# Patient Record
Sex: Female | Born: 1980 | Race: Black or African American | Hispanic: No | Marital: Single | Smoking: Never smoker
Health system: Southern US, Community
[De-identification: ages and names within clinical notes are randomized; demographics above are authoritative.]

## PROBLEM LIST (undated history)

## (undated) DIAGNOSIS — D8989 Other specified disorders involving the immune mechanism, not elsewhere classified: Secondary | ICD-10-CM

## (undated) HISTORY — PX: INDUCED ABORTION: SHX677

## (undated) HISTORY — PX: DENTAL SURGERY: SHX609

## (undated) HISTORY — PX: TUBAL LIGATION: SHX77

## (undated) HISTORY — PX: BACK SURGERY: SHX140

---

## 2017-05-04 ENCOUNTER — Emergency Department (HOSPITAL_COMMUNITY)
Admission: EM | Admit: 2017-05-04 | Discharge: 2017-05-04 | Disposition: A | Discharge status: Home / Self Care | Payer: Medicare Other | Attending: Emergency Medicine | Admitting: Emergency Medicine

## 2017-05-04 ENCOUNTER — Encounter (HOSPITAL_COMMUNITY): Payer: Self-pay | Admitting: Emergency Medicine

## 2017-05-04 DIAGNOSIS — Z5321 Procedure and treatment not carried out due to patient leaving prior to being seen by health care provider: Secondary | ICD-10-CM | POA: Insufficient documentation

## 2017-05-04 DIAGNOSIS — R21 Rash and other nonspecific skin eruption: Secondary | ICD-10-CM | POA: Diagnosis not present

## 2017-05-04 HISTORY — DX: Other specified disorders involving the immune mechanism, not elsewhere classified: D89.89

## 2017-05-04 NOTE — ED Triage Notes (Signed)
Pt from home with c/o rash to skin on arms and legs x 2 days. Pt states she got a TB test 2 days ago. Pt states she got a reaction shortly thereafter where she got hives on all of her extremities. [facility] where she was given prednisone and benadryl. Pt states reaction came back today. No visible hives. No airway obstruction

## 2017-05-04 NOTE — ED Triage Notes (Signed)
Pt no longer in the lobby when called for a room

## 2017-05-23 ENCOUNTER — Encounter (HOSPITAL_COMMUNITY): Payer: Self-pay | Admitting: Emergency Medicine

## 2017-05-23 ENCOUNTER — Emergency Department (HOSPITAL_COMMUNITY): Payer: Medicare Other

## 2017-05-23 DIAGNOSIS — X509XXA Other and unspecified overexertion or strenuous movements or postures, initial encounter: Secondary | ICD-10-CM | POA: Insufficient documentation

## 2017-05-23 DIAGNOSIS — S8991XA Unspecified injury of right lower leg, initial encounter: Secondary | ICD-10-CM | POA: Insufficient documentation

## 2017-05-23 DIAGNOSIS — M25461 Effusion, right knee: Secondary | ICD-10-CM | POA: Insufficient documentation

## 2017-05-23 DIAGNOSIS — Y999 Unspecified external cause status: Secondary | ICD-10-CM | POA: Diagnosis not present

## 2017-05-23 DIAGNOSIS — Y9389 Activity, other specified: Secondary | ICD-10-CM | POA: Insufficient documentation

## 2017-05-23 DIAGNOSIS — Y92009 Unspecified place in unspecified non-institutional (private) residence as the place of occurrence of the external cause: Secondary | ICD-10-CM | POA: Insufficient documentation

## 2017-05-23 NOTE — ED Triage Notes (Addendum)
Patient c/o right leg pain after falling out of chair while fixing washing machine. Denies head injury and LOC. Hx lymphedema. Ice applied in triage.

## 2017-05-24 ENCOUNTER — Emergency Department (HOSPITAL_COMMUNITY)
Admission: EM | Admit: 2017-05-24 | Discharge: 2017-05-24 | Disposition: A | Discharge status: Home / Self Care | Payer: Medicare Other | Attending: Emergency Medicine | Admitting: Emergency Medicine

## 2017-05-24 ENCOUNTER — Emergency Department (HOSPITAL_COMMUNITY): Payer: Medicare Other

## 2017-05-24 DIAGNOSIS — S8991XA Unspecified injury of right lower leg, initial encounter: Secondary | ICD-10-CM

## 2017-05-24 MED ORDER — IBUPROFEN 800 MG PO TABS
800.0000 mg | ORAL_TABLET | Freq: Once | ORAL | Status: AC | PRN
  Administered 2017-05-24: 800 mg via ORAL
  Filled 2017-05-24: qty 1

## 2017-05-24 MED ORDER — HYDROCODONE-ACETAMINOPHEN 5-325 MG PO TABS: 1.0000 | ORAL_TABLET | Freq: Four times a day (QID) | ORAL | 0 refills | Status: AC | PRN

## 2017-05-24 MED ORDER — NAPROXEN 500 MG PO TABS: 500.0000 mg | ORAL_TABLET | Freq: Two times a day (BID) | ORAL | 0 refills | Status: AC

## 2017-05-24 MED ORDER — KETOROLAC TROMETHAMINE 30 MG/ML IJ SOLN
30.0000 mg | Freq: Once | INTRAMUSCULAR | Status: AC
  Administered 2017-05-24: 30 mg via INTRAVENOUS

## 2017-05-24 MED ORDER — KETOROLAC TROMETHAMINE 30 MG/ML IJ SOLN
INTRAMUSCULAR | Status: AC
  Administered 2017-05-24: 30 mg via INTRAVENOUS
  Filled 2017-05-24: qty 1

## 2017-05-24 NOTE — ED Notes (Signed)
Bed: WA08 Expected date:  Expected time:  Means of arrival:  Comments: 

## 2017-05-24 NOTE — ED Provider Notes (Signed)
[facility]-EMERGENCY DEPT Provider Note   CSN: [id number] Arrival date & time: 05/23/17  2108     History   Chief Complaint Chief Complaint  Patient presents with  . Leg Pain    HPI Regina Lawrence is a 36 y.o. female.  HPI Regina Lawrence is a 36 y.o. female presents to emergency department complaining of right leg injury. Patient states that she was standing on a chair trying to fix her washer when she slipped and fell down onto the floor. Patient landed on the right side. She reports pain to the right knee and ankle, states that her knee is very swollen and she is unable to put any weight. She reports mild tingling sensation to the knee, otherwise no numbness or weakness. Denies head injury. No loss of consciousness. No other complaints.  Past Medical History:  Diagnosis Date  . Autoimmune disorder (HCC)     There are no active problems to display for this patient.   Past Surgical History:  Procedure Laterality Date  . BACK SURGERY    . DENTAL SURGERY    . INDUCED ABORTION    . TUBAL LIGATION      OB History    No data available       Home Medications    Prior to Admission medications   Not on File    Family History History reviewed. No pertinent family history.  Social History Social History  Substance Use Topics  . Smoking status: Never Smoker  . Smokeless tobacco: Never Used  . Alcohol use No     Allergies   Morphine and related and Dalvance [dalbavancin]   Review of Systems Review of Systems  Constitutional: Negative for chills and fever.  Respiratory: Negative for cough, chest tightness and shortness of breath.   Cardiovascular: Negative for chest pain, palpitations and leg swelling.  Gastrointestinal: Negative for abdominal pain, diarrhea, nausea and vomiting.  Genitourinary: Negative for dysuria, flank pain and pelvic pain.  Musculoskeletal: Positive for arthralgias, gait problem and joint swelling. Negative for myalgias, neck pain and neck  stiffness.  Skin: Negative for rash.  Neurological: Negative for dizziness, weakness and headaches.  All other systems reviewed and are negative.    Physical Exam Updated Vital Signs BP (!) 143/96 (BP Location: Left Arm)   Pulse 96   Temp 99.3 F (37.4 C) (Oral)   Resp 18   LMP 05/23/2017   SpO2 100%   Physical Exam  Constitutional: She appears well-developed and well-nourished. No distress.  HENT:  Head: Normocephalic.  Eyes: Conjunctivae are normal.  Neck: Neck supple.  Cardiovascular: Normal rate, regular rhythm and normal heart sounds.   Pulmonary/Chest: Effort normal and breath sounds normal. No respiratory distress. She has no wheezes. She has no rales.  Musculoskeletal: She exhibits no edema.  Large knee effusion. Range of motion limited due to pain. Pain with any flexion of the knee. Diffuse tenderness to palpation. Stability of the knee joint is limited due to pain during exam. Diffuse tenderness to palpation of the ankle. Full range of motion of the ankle joint.  Neurological: She is alert.  Skin: Skin is warm and dry.  Psychiatric: She has a normal mood and affect. Her behavior is normal.  Nursing note and vitals reviewed.    ED Treatments / Results  Labs (all labs ordered are listed, but only abnormal results are displayed) Labs Reviewed - No data to display  EKG  EKG Interpretation None       Radiology Dg Ankle  Complete Right  Result Date: 05/23/2017 CLINICAL DATA:  Post fall with right knee and ankle pain. EXAM: RIGHT ANKLE - COMPLETE 3+ VIEW COMPARISON:  None. FINDINGS: There is no evidence of fracture, dislocation, or joint effusion. There is no evidence of arthropathy or other focal bone abnormality. Diffuse soft tissue edema. IMPRESSION: Diffuse soft tissue edema.  No osseous abnormality. Electronically Signed   By: Rubye Oaks M.D.   On: 05/23/2017 23:08   Ct Knee Right Wo Contrast  Result Date: 05/24/2017 CLINICAL DATA:  Knee pain after  fall from chair. EXAM: CT OF THE RIGHT KNEE WITHOUT CONTRAST TECHNIQUE: Multidetector CT imaging of the RIGHT knee was performed according to the standard protocol. Multiplanar CT image reconstructions were also generated. COMPARISON:  05/23/2017 FINDINGS: Bones/Joint/Cartilage Moderate suprapatellar joint effusion. Subtle lucency along the posteromesial tibial epiphyseal rim, series 8 image 50 and series 7, image 66 suspicious for a tiny nondisplaced fracture that may explain the joint effusion. Ligaments Suboptimally assessed by CT. Muscles and Tendons Negative Soft tissues Negative IMPRESSION: Moderate suprapatellar joint effusion. Subtle linear lucency involving the posteromesial rim of the tibial epiphysis suspicious for nondisplaced fracture. No joint dislocation. Electronically Signed   By: Tollie Eth M.D.   On: 05/24/2017 03:32   Dg Knee Complete 4 Views Right  Result Date: 05/23/2017 CLINICAL DATA:  Right knee and ankle pain after fall. EXAM: RIGHT KNEE - COMPLETE 4+ VIEW COMPARISON:  None. FINDINGS: No evidence of fracture or dislocation. Small to moderate joint effusion. No evidence of arthropathy or other focal bone abnormality. Soft tissues are unremarkable. IMPRESSION: Joint effusion.  No osseous abnormality. Electronically Signed   By: Rubye Oaks M.D.   On: 05/23/2017 23:12    Procedures Procedures (including critical care time)  Medications Ordered in ED Medications  ibuprofen (ADVIL,MOTRIN) tablet 800 mg (800 mg Oral Given 05/24/17 0138)  ketorolac (TORADOL) 30 MG/ML injection 30 mg (30 mg Intravenous Given 05/24/17 0349)     Initial Impression / Assessment and Plan / ED Course  I have reviewed the triage vital signs and the nursing notes.  Pertinent labs & imaging results that were available during my care of the patient were reviewed by me and considered in my medical decision making (see chart for details).     Patient in the emergency department after a fall off of a  chair. Complaining of right knee and ankle pain. Large joint effusion on the knee x-ray. Patient is unable to apply any weight on the leg. Will get CT scan for rule out occult fracture. Difficulty to assess for stability of the joint due to pain.  CT scan showing septal linear lucency involving the posterior medial rim of the tibial epiphysis which is suspicious for nondisplaced fracture. Given this finding, we'll place in a knee immobilizer. Will elevate leg at home. Ice. Norco and naproxen for pain. Discussed return precautions, especially signs and symptoms of compartment syndrome. At this time her compartments are soft, pulses are intact. Sensation and capillary refill less than 2 seconds in her toes. She is stable for discharge home. Orthopedics follow-up provided.   Vitals:   05/23/17 2234 05/24/17 0147 05/24/17 0605  BP: 140/89 (!) 143/96 118/78  Pulse: 100 96 89  Resp: (!) 187 18 16  Temp: 99.3 F (37.4 C)    TempSrc: Oral    SpO2: 100% 100% 100%     Final Clinical Impressions(s) / ED Diagnoses   Final diagnoses:  Injury of right knee, initial encounter  New Prescriptions Discharge Medication List as of 05/24/2017  5:26 AM    START taking these medications   Details  HYDROcodone-acetaminophen (NORCO) 5-325 MG tablet Take 1 tablet by mouth every 6 (six) hours as needed for moderate pain., Starting Fri 05/24/2017, Print    naproxen (NAPROSYN) 500 MG tablet Take 1 tablet (500 mg total) by mouth 2 (two) times daily., Starting Fri 05/24/2017, Print         Crew Goren, [place], PA-C 05/24/17 0615    Molpus, Jonny Ruiz, MD 05/24/17 [phone]

## 2017-05-24 NOTE — Discharge Instructions (Signed)
Ice and elevate your leg several times a day. Use crutches, no weightbearing on your  right leg. Follow-up with orthopedics doctors referred. Naprosyn for pain. Norco for severe pain. Return if any numbness, increased pain to her foot, any new concerning symptoms.

## 2018-04-27 IMAGING — CR DG KNEE COMPLETE 4+V*R*
4 series · 4 of 4 positions shown · non-contrast
Comparison: None.

CLINICAL DATA: Right knee and ankle pain after fall.

EXAM:
RIGHT KNEE - COMPLETE 4+ VIEW

[t knee ap right]
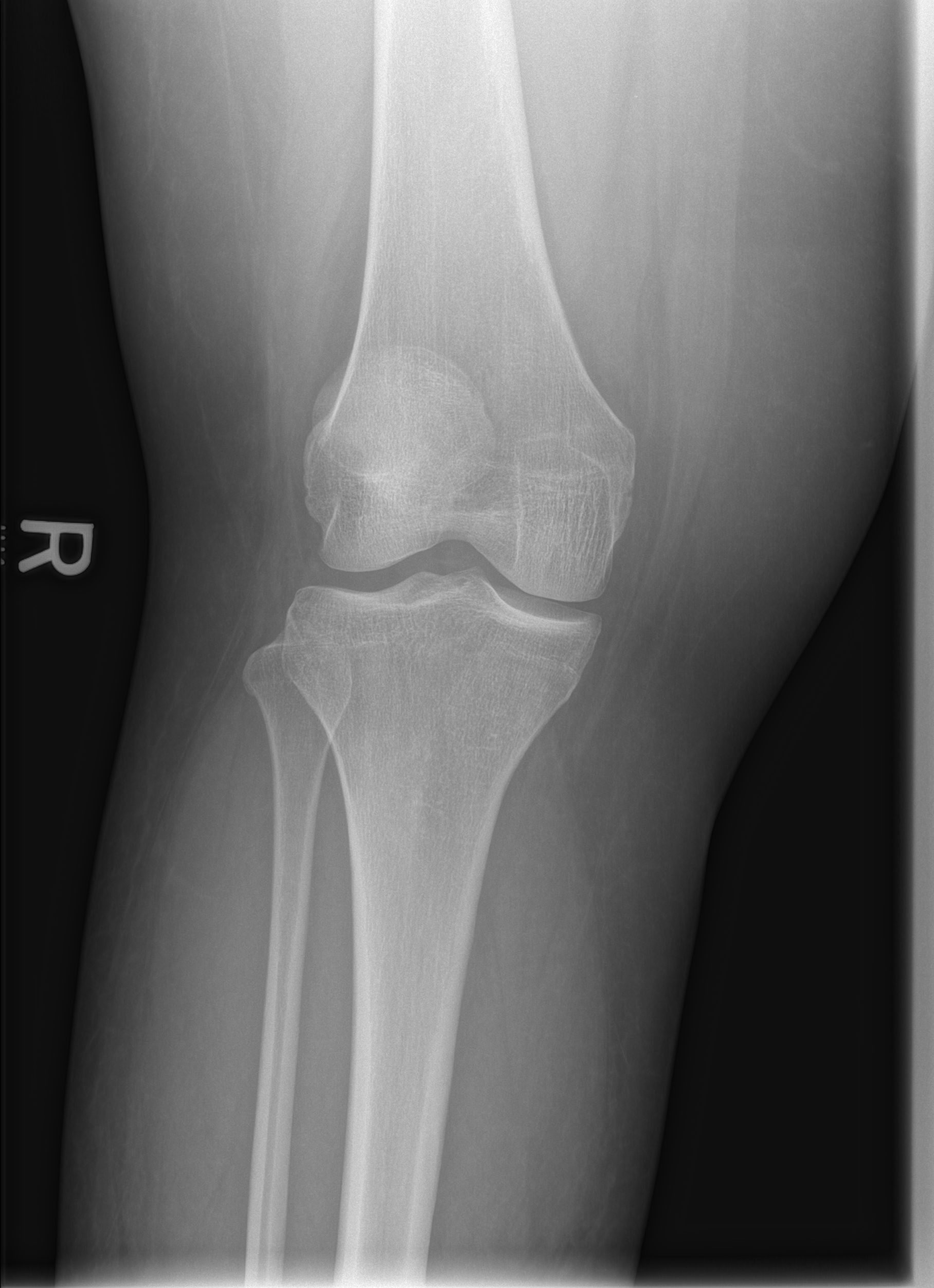

[t knee obl right (1 of 2)]
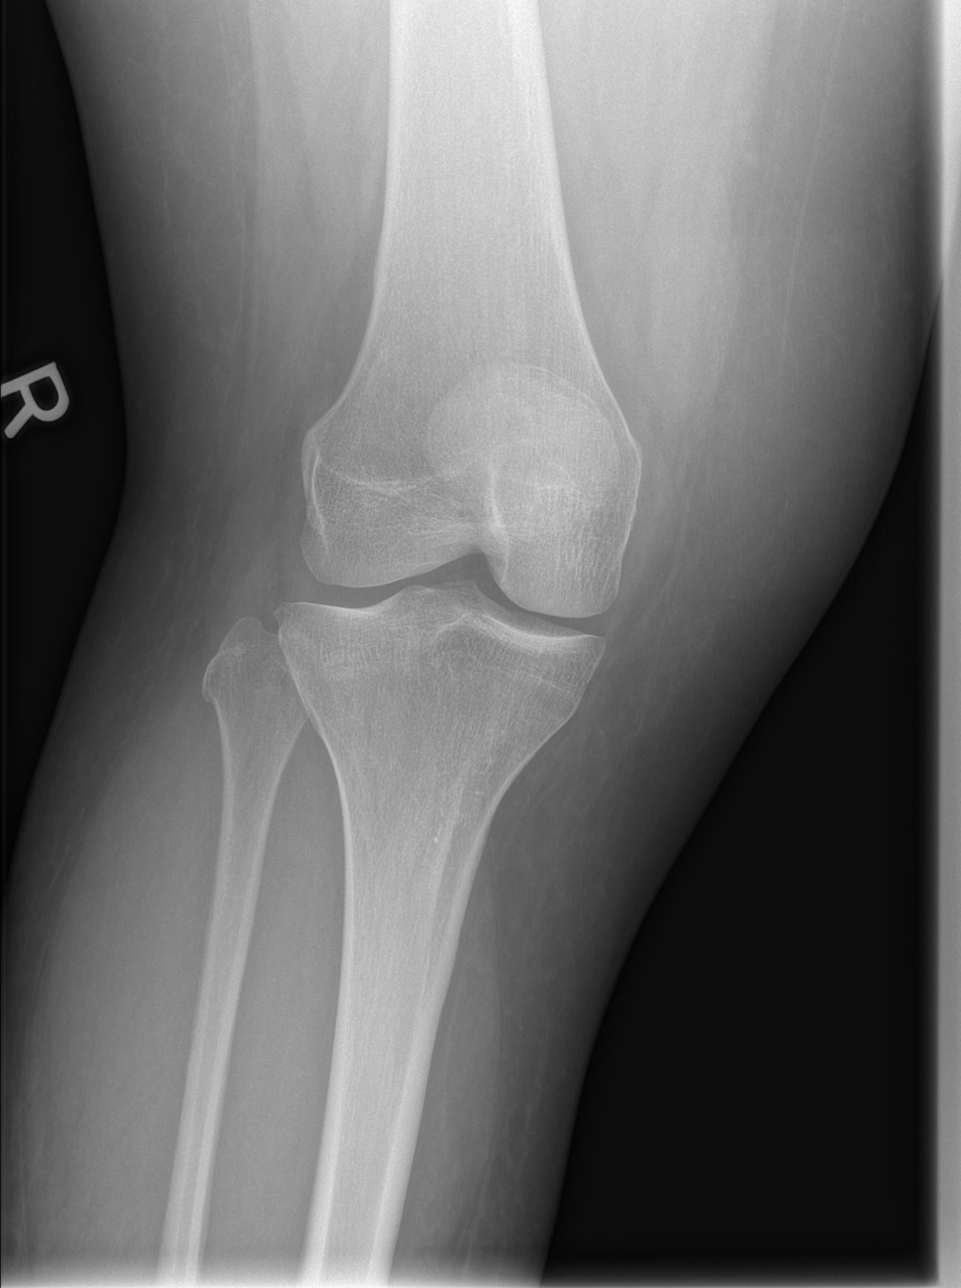

[t knee obl right (2 of 2)]
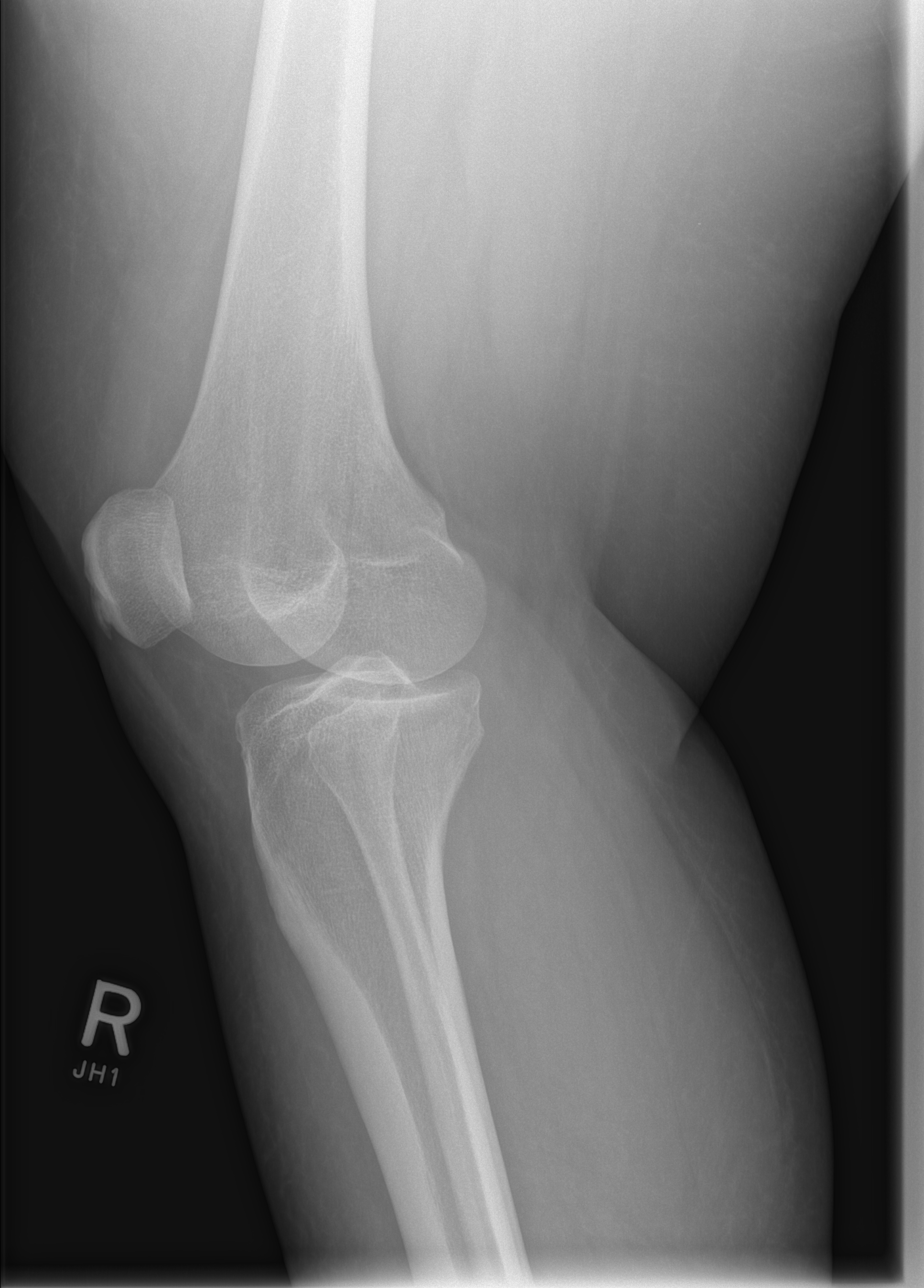

[t knee lat right]
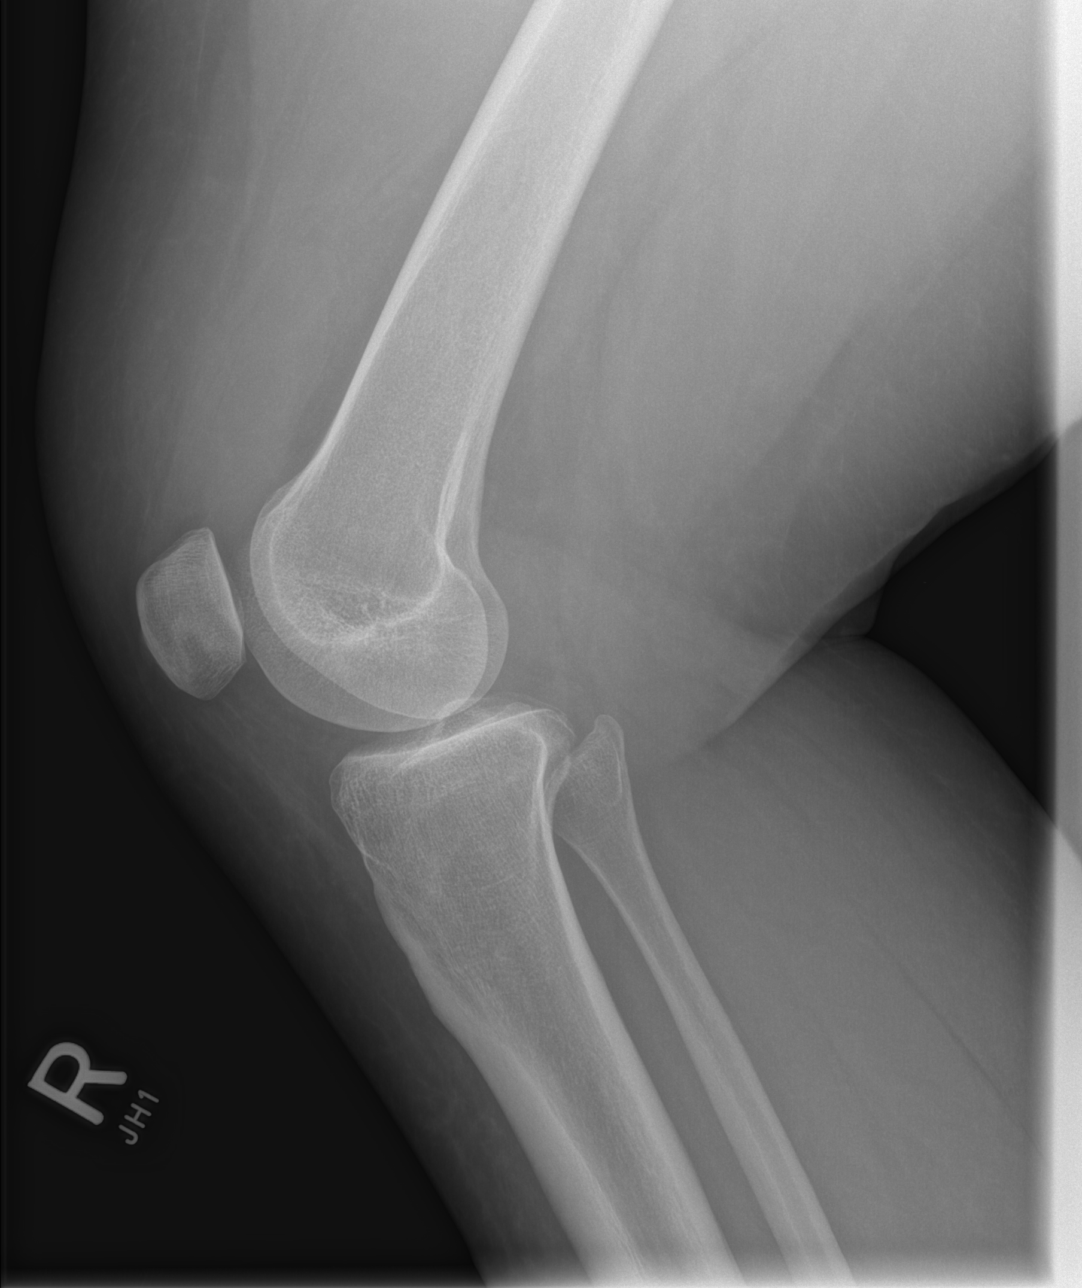

[4 of 4 positions shown; findings below may reference images not displayed]

FINDINGS: No evidence of fracture or dislocation. Small to moderate joint
effusion. No evidence of arthropathy or other focal bone
abnormality. Soft tissues are unremarkable.
IMPRESSION: Joint effusion.  No osseous abnormality.
